# Patient Record
Sex: Male | Born: 1980 | Race: Black or African American | Hispanic: No | Marital: Single | Smoking: Former smoker
Health system: Southern US, Community
[De-identification: ages and names within clinical notes are randomized; demographics above are authoritative.]

## PROBLEM LIST (undated history)

## (undated) ENCOUNTER — Ambulatory Visit

## (undated) ENCOUNTER — Ambulatory Visit: Payer: No Typology Code available for payment source

## (undated) DIAGNOSIS — G830 Diplegia of upper limbs: Secondary | ICD-10-CM

## (undated) DIAGNOSIS — Y249XXA Unspecified firearm discharge, undetermined intent, initial encounter: Secondary | ICD-10-CM

## (undated) DIAGNOSIS — W3400XA Accidental discharge from unspecified firearms or gun, initial encounter: Secondary | ICD-10-CM

## (undated) HISTORY — PX: SPINE SURGERY: SHX786

## (undated) HISTORY — PX: BACK SURGERY: SHX140

## (undated) HISTORY — PX: ABDOMINAL SURGERY: SHX537

---
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
## 2003-04-17 ENCOUNTER — Emergency Department (HOSPITAL_COMMUNITY): Admission: EM | Admit: 2003-04-17 | Discharge: 2003-04-17 | Payer: Self-pay | Admitting: Emergency Medicine

## 2004-03-02 ENCOUNTER — Ambulatory Visit: Payer: Self-pay | Admitting: Internal Medicine

## 2004-09-13 ENCOUNTER — Emergency Department (HOSPITAL_COMMUNITY): Admission: EM | Admit: 2004-09-13 | Discharge: 2004-09-13 | Payer: Self-pay | Admitting: *Deleted

## 2005-07-17 ENCOUNTER — Ambulatory Visit: Payer: Self-pay | Admitting: Physical Medicine & Rehabilitation

## 2005-07-17 ENCOUNTER — Inpatient Hospital Stay (HOSPITAL_COMMUNITY): Admission: AC | Admit: 2005-07-17 | Discharge: 2005-07-31 | Payer: Self-pay

## 2005-07-31 ENCOUNTER — Inpatient Hospital Stay (HOSPITAL_COMMUNITY)
Admission: RE | Admit: 2005-07-31 | Discharge: 2005-08-16 | Payer: Self-pay | Admitting: Physical Medicine & Rehabilitation

## 2005-08-19 ENCOUNTER — Encounter
Admission: RE | Admit: 2005-08-19 | Discharge: 2005-11-17 | Payer: Self-pay | Admitting: Physical Medicine & Rehabilitation

## 2005-08-20 ENCOUNTER — Ambulatory Visit: Payer: Self-pay | Admitting: Family Medicine

## 2005-09-17 ENCOUNTER — Ambulatory Visit: Payer: Self-pay | Admitting: Family Medicine

## 2005-09-19 ENCOUNTER — Ambulatory Visit: Payer: Self-pay | Admitting: Physical Medicine & Rehabilitation

## 2005-09-19 ENCOUNTER — Encounter
Admission: RE | Admit: 2005-09-19 | Discharge: 2005-12-18 | Payer: Self-pay | Admitting: Physical Medicine & Rehabilitation

## 2005-10-31 ENCOUNTER — Ambulatory Visit (HOSPITAL_COMMUNITY): Admission: RE | Admit: 2005-10-31 | Discharge: 2005-10-31 | Payer: Self-pay | Admitting: Urology

## 2005-11-18 ENCOUNTER — Encounter
Admission: RE | Admit: 2005-11-18 | Discharge: 2006-02-16 | Payer: Self-pay | Admitting: Physical Medicine & Rehabilitation

## 2005-12-12 ENCOUNTER — Ambulatory Visit: Payer: Self-pay | Admitting: Family Medicine

## 2006-01-13 ENCOUNTER — Encounter: Admission: RE | Admit: 2006-01-13 | Discharge: 2006-01-13 | Payer: Self-pay | Admitting: Neurological Surgery

## 2006-02-17 ENCOUNTER — Encounter
Admission: RE | Admit: 2006-02-17 | Discharge: 2006-05-18 | Payer: Self-pay | Admitting: Physical Medicine & Rehabilitation

## 2006-03-18 DIAGNOSIS — F5232 Male orgasmic disorder: Secondary | ICD-10-CM | POA: Insufficient documentation

## 2006-03-18 DIAGNOSIS — G834 Cauda equina syndrome: Secondary | ICD-10-CM

## 2006-03-18 HISTORY — DX: Cauda equina syndrome: G83.4

## 2006-03-18 HISTORY — DX: Male orgasmic disorder: F52.32

## 2006-05-08 ENCOUNTER — Ambulatory Visit: Payer: Self-pay | Admitting: Family Medicine

## 2006-05-08 ENCOUNTER — Encounter: Payer: Self-pay | Admitting: Family Medicine

## 2006-05-08 DIAGNOSIS — G4700 Insomnia, unspecified: Secondary | ICD-10-CM

## 2006-05-08 DIAGNOSIS — M6281 Muscle weakness (generalized): Secondary | ICD-10-CM | POA: Insufficient documentation

## 2006-05-08 HISTORY — DX: Insomnia, unspecified: G47.00

## 2006-05-19 ENCOUNTER — Encounter
Admission: RE | Admit: 2006-05-19 | Discharge: 2006-08-17 | Payer: Self-pay | Admitting: Physical Medicine & Rehabilitation

## 2006-08-14 ENCOUNTER — Encounter
Admission: RE | Admit: 2006-08-14 | Discharge: 2006-11-12 | Payer: Self-pay | Admitting: Physical Medicine & Rehabilitation

## 2007-09-02 ENCOUNTER — Encounter: Payer: Self-pay | Admitting: Family Medicine

## 2007-12-29 ENCOUNTER — Emergency Department (HOSPITAL_COMMUNITY): Admission: EM | Admit: 2007-12-29 | Discharge: 2007-12-29 | Payer: Self-pay | Admitting: Family Medicine

## 2008-03-04 ENCOUNTER — Emergency Department (HOSPITAL_COMMUNITY): Admission: EM | Admit: 2008-03-04 | Discharge: 2008-03-04 | Payer: Self-pay | Admitting: Emergency Medicine

## 2008-03-16 ENCOUNTER — Encounter: Admission: RE | Admit: 2008-03-16 | Discharge: 2008-04-18 | Payer: Self-pay | Admitting: Orthopedic Surgery

## 2008-07-25 ENCOUNTER — Emergency Department (HOSPITAL_BASED_OUTPATIENT_CLINIC_OR_DEPARTMENT_OTHER): Admission: EM | Admit: 2008-07-25 | Discharge: 2008-07-25 | Payer: Self-pay | Admitting: Emergency Medicine

## 2008-07-25 ENCOUNTER — Ambulatory Visit: Payer: Self-pay | Admitting: Radiology

## 2008-09-15 ENCOUNTER — Encounter: Admission: RE | Admit: 2008-09-15 | Discharge: 2008-10-03 | Payer: Self-pay | Admitting: Neurological Surgery

## 2009-03-15 ENCOUNTER — Emergency Department (HOSPITAL_COMMUNITY): Admission: EM | Admit: 2009-03-15 | Discharge: 2009-03-15 | Payer: Self-pay | Admitting: Family Medicine

## 2010-07-01 ENCOUNTER — Encounter: Payer: Self-pay | Admitting: Urology
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
## 2011-05-04 ENCOUNTER — Encounter: Payer: Self-pay | Admitting: *Deleted

## 2011-05-04 ENCOUNTER — Emergency Department (HOSPITAL_BASED_OUTPATIENT_CLINIC_OR_DEPARTMENT_OTHER)
Admission: EM | Admit: 2011-05-04 | Discharge: 2011-05-04 | Disposition: A | Discharge status: Home / Self Care | Payer: Medicare Other | Attending: Emergency Medicine | Admitting: Emergency Medicine

## 2011-05-04 DIAGNOSIS — K047 Periapical abscess without sinus: Secondary | ICD-10-CM | POA: Insufficient documentation

## 2011-05-04 DIAGNOSIS — S0993XA Unspecified injury of face, initial encounter: Secondary | ICD-10-CM

## 2011-05-04 DIAGNOSIS — K0381 Cracked tooth: Secondary | ICD-10-CM | POA: Insufficient documentation

## 2011-05-04 DIAGNOSIS — K089 Disorder of teeth and supporting structures, unspecified: Secondary | ICD-10-CM | POA: Insufficient documentation

## 2011-05-04 MED ORDER — AMOXICILLIN 500 MG PO CAPS: 500.0000 mg | ORAL_CAPSULE | Freq: Three times a day (TID) | ORAL | Status: AC

## 2011-05-04 MED ORDER — HYDROCODONE-ACETAMINOPHEN 5-325 MG PO TABS: 1.0000 | ORAL_TABLET | ORAL | Status: AC | PRN

## 2011-05-04 NOTE — ED Provider Notes (Signed)
History     CSN: [id number] Arrival date & time: 05/04/2011  6:27 PM   First MD Initiated Contact with Patient 05/04/11 1837      Chief Complaint  Patient presents with  . Dental Pain    (Consider location/radiation/quality/duration/timing/severity/associated sxs/prior treatment) HPI Comments: Patient here after cracking his left #19 tooth on Thanksgiving - reports swelling around the tooth that started yesterday  Patient is a 30 y.o. male presenting with tooth pain. The history is provided by the patient. No language interpreter was used.  Dental PainThe primary symptoms include dental injury. Primary symptoms do not include oral bleeding, oral lesions, fever, sore throat or cough. The symptoms began 2 days ago. The symptoms are worsening. The symptoms are new. The symptoms occur constantly.  Additional symptoms include: dental sensitivity to temperature, gum swelling and gum tenderness. Additional symptoms do not include: trismus, facial swelling, trouble swallowing, dry mouth, drooling and ear pain. Medical issues include: periodontal disease.    History reviewed. No pertinent past medical history.  Past Surgical History  Procedure Date  . Back surgery     History reviewed. No pertinent family history.  History  Substance Use Topics  . Smoking status: Not on file  . Smokeless tobacco: Not on file  . Alcohol Use:       Review of Systems  Constitutional: Negative for fever.  HENT: Negative for ear pain, sore throat, facial swelling, drooling and trouble swallowing.   Respiratory: Negative for cough.   All other systems reviewed and are negative.    Allergies  Review of patient's allergies indicates no known allergies.  Home Medications   Current Outpatient Rx  Name Route Sig Dispense Refill  . IBUPROFEN 200 MG PO TABS Oral Take 400 mg by mouth every 6 (six) hours as needed. For pain     . ONE-DAILY MULTI VITAMINS PO TABS Oral Take 1 tablet by mouth daily.          BP 122/92  Pulse 72  Temp(Src) 98.1 F (36.7 C) (Oral)  Resp 18  Ht 6' (1.829 m)  Wt 165 lb (74.844 kg)  BMI 22.38 kg/m2  SpO2 98%  Physical Exam  Nursing note and vitals reviewed. Constitutional: He is oriented to person, place, and time. He appears well-developed and well-nourished.  HENT:  Head: Normocephalic and atraumatic.  Right Ear: External ear normal.  Left Ear: External ear normal.  Nose: Nose normal.  Mouth/Throat: Oropharynx is clear and moist. Abnormal dentition. Dental abscesses present.    Eyes: Pupils are equal, round, and reactive to light.  Neck: Normal range of motion. Neck supple.  Cardiovascular: Normal rate, regular rhythm and normal heart sounds.   Pulmonary/Chest: Effort normal and breath sounds normal. No respiratory distress.  Abdominal: Soft. There is tenderness.  Musculoskeletal: Normal range of motion.  Neurological: He is alert and oriented to person, place, and time.  Skin: Skin is warm and dry.  Psychiatric: He has a normal mood and affect.    ED Course  Procedures (including critical care time)  Labs Reviewed - No data to display No results found.   Tooth pain with abscess   MDM  Patient with dental pain and now abscess formation, there is no trismus and the patient is able to tolerate po's - will follow up with a dentist this coming week.        Izola Price [place], [place] 05/04/11 1851

## 2011-05-04 NOTE — ED Notes (Signed)
Pt states his left lower tooth has been hurting since Thanksgiving

## 2011-05-04 NOTE — ED Provider Notes (Signed)
Medical screening examination/treatment/procedure(s) were performed by non-physician practitioner and as supervising physician I was immediately available for consultation/collaboration.   Nat Christen, MD 05/04/11 [phone]

## 2012-05-11 ENCOUNTER — Inpatient Hospital Stay (HOSPITAL_COMMUNITY)
Admit: 2012-05-11 | Discharge: 2012-05-13 | DRG: 908 | Disposition: A | Discharge status: Home / Self Care | Payer: Medicare Other | Attending: Emergency Medicine | Admitting: Emergency Medicine

## 2012-05-11 ENCOUNTER — Emergency Department (HOSPITAL_COMMUNITY): Payer: Medicare Other

## 2012-05-11 DIAGNOSIS — S15009A Unspecified injury of unspecified carotid artery, initial encounter: Secondary | ICD-10-CM | POA: Diagnosis present

## 2012-05-11 DIAGNOSIS — S15309A Unspecified injury of unspecified internal jugular vein, initial encounter: Principal | ICD-10-CM | POA: Diagnosis present

## 2012-05-11 DIAGNOSIS — T148XXA Other injury of unspecified body region, initial encounter: Secondary | ICD-10-CM

## 2012-05-11 DIAGNOSIS — S0181XA Laceration without foreign body of other part of head, initial encounter: Secondary | ICD-10-CM | POA: Diagnosis present

## 2012-05-11 DIAGNOSIS — Y998 Other external cause status: Secondary | ICD-10-CM

## 2012-05-11 DIAGNOSIS — IMO0002 Reserved for concepts with insufficient information to code with codable children: Secondary | ICD-10-CM

## 2012-05-11 DIAGNOSIS — D62 Acute posthemorrhagic anemia: Secondary | ICD-10-CM | POA: Diagnosis present

## 2012-05-11 DIAGNOSIS — Y929 Unspecified place or not applicable: Secondary | ICD-10-CM

## 2012-05-11 DIAGNOSIS — S0180XA Unspecified open wound of other part of head, initial encounter: Secondary | ICD-10-CM | POA: Diagnosis present

## 2012-05-11 DIAGNOSIS — F17200 Nicotine dependence, unspecified, uncomplicated: Secondary | ICD-10-CM | POA: Diagnosis present

## 2012-05-11 DIAGNOSIS — G8389 Other specified paralytic syndromes: Secondary | ICD-10-CM | POA: Diagnosis present

## 2012-05-11 HISTORY — DX: Diplegia of upper limbs: G83.0

## 2012-05-11 HISTORY — DX: Accidental discharge from unspecified firearms or gun, initial encounter: W34.00XA

## 2012-05-11 HISTORY — DX: Unspecified firearm discharge, undetermined intent, initial encounter: Y24.9XXA

## 2012-05-11 LAB — CG4 I-STAT (LACTIC ACID): Lactic Acid, Venous: 3.27 mmol/L — ABNORMAL HIGH (ref 0.5–2.2)

## 2012-05-11 LAB — POCT I-STAT, CHEM 8
Calcium, Ion: 1.1 mmol/L — ABNORMAL LOW (ref 1.12–1.23)
Chloride: 102 mEq/L (ref 96–112)
Glucose, Bld: 97 mg/dL (ref 70–99)
HCT: 40 % (ref 39.0–52.0)
TCO2: 22 mmol/L (ref 0–100)

## 2012-05-11 LAB — CBC
HCT: 35.7 % — ABNORMAL LOW (ref 39.0–52.0)
Hemoglobin: 12.6 g/dL — ABNORMAL LOW (ref 13.0–17.0)
MCV: 89 fL (ref 78.0–100.0)
RBC: 4.01 MIL/uL — ABNORMAL LOW (ref 4.22–5.81)
WBC: 9.5 10*3/uL (ref 4.0–10.5)

## 2012-05-11 LAB — PROTIME-INR
INR: 1.11 (ref 0.00–1.49)
Prothrombin Time: 14.2 seconds (ref 11.6–15.2)

## 2012-05-11 MED ORDER — FENTANYL CITRATE 0.05 MG/ML IJ SOLN
50.0000 ug | INTRAMUSCULAR | Status: DC | PRN
  Administered 2012-05-12: 50 ug via INTRAVENOUS
  Filled 2012-05-11: qty 2

## 2012-05-11 MED ORDER — TETANUS-DIPHTH-ACELL PERTUSSIS 5-2.5-18.5 LF-MCG/0.5 IM SUSP
0.5000 mL | Freq: Once | INTRAMUSCULAR | Status: AC
  Administered 2012-05-12: 0.5 mL via INTRAMUSCULAR
  Filled 2012-05-11: qty 0.5

## 2012-05-11 NOTE — ED Notes (Signed)
ont CT talble at this time, preparing to scan neck and face, VSS, no changes, alert, NAD, calm, interactive, speech clear, resps e/u. Dr. Dwain Sarna present in scanner room.

## 2012-05-11 NOTE — ED Notes (Signed)
To CT, no changes, VSS.  
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
## 2012-05-11 NOTE — ED Notes (Signed)
Emergency release blood received from blood bank

## 2012-05-12 ENCOUNTER — Encounter (HOSPITAL_COMMUNITY): Payer: Self-pay | Admitting: *Deleted

## 2012-05-12 ENCOUNTER — Encounter (HOSPITAL_COMMUNITY): Payer: Self-pay | Admitting: Anesthesiology

## 2012-05-12 ENCOUNTER — Emergency Department (HOSPITAL_COMMUNITY): Payer: Medicare Other | Admitting: Anesthesiology

## 2012-05-12 ENCOUNTER — Encounter (HOSPITAL_COMMUNITY): Disposition: A | Discharge status: Home / Self Care | Payer: Self-pay

## 2012-05-12 DIAGNOSIS — S15009A Unspecified injury of unspecified carotid artery, initial encounter: Secondary | ICD-10-CM

## 2012-05-12 DIAGNOSIS — S0180XA Unspecified open wound of other part of head, initial encounter: Secondary | ICD-10-CM

## 2012-05-12 DIAGNOSIS — W268XXA Contact with other sharp object(s), not elsewhere classified, initial encounter: Secondary | ICD-10-CM

## 2012-05-12 DIAGNOSIS — D62 Acute posthemorrhagic anemia: Secondary | ICD-10-CM

## 2012-05-12 HISTORY — PX: ENDARTERECTOMY: SHX5162

## 2012-05-12 LAB — COMPREHENSIVE METABOLIC PANEL
Albumin: 4 g/dL (ref 3.5–5.2)
Alkaline Phosphatase: 75 U/L (ref 39–117)
BUN: 12 mg/dL (ref 6–23)
Calcium: 8.7 mg/dL (ref 8.4–10.5)
Creatinine, Ser: 0.87 mg/dL (ref 0.50–1.35)
GFR calc Af Amer: >90 mL/min (ref >90)
Glucose, Bld: 101 mg/dL — ABNORMAL HIGH (ref 70–99)
Potassium: 2.9 mEq/L — ABNORMAL LOW (ref 3.5–5.1)
Total Protein: 6.8 g/dL (ref 6.0–8.3)

## 2012-05-12 LAB — CBC
MCH: 31.1 pg (ref 26.0–34.0)
MCHC: 35 g/dL (ref 30.0–36.0)
MCV: 88.9 fL (ref 78.0–100.0)
Platelets: 212 10*3/uL (ref 150–400)
RDW: 12.6 % (ref 11.5–15.5)

## 2012-05-12 LAB — URINALYSIS, MICROSCOPIC ONLY
Bilirubin Urine: NEGATIVE
Glucose, UA: NEGATIVE mg/dL
Specific Gravity, Urine: 1.01 (ref 1.005–1.030)
pH: 6.5 (ref 5.0–8.0)

## 2012-05-12 LAB — BASIC METABOLIC PANEL
Calcium: 8.1 mg/dL — ABNORMAL LOW (ref 8.4–10.5)
Creatinine, Ser: 0.76 mg/dL (ref 0.50–1.35)
GFR calc non Af Amer: >90 mL/min (ref >90)
Sodium: 139 mEq/L (ref 135–145)

## 2012-05-12 SURGERY — ENDARTERECTOMY, CAROTID
Anesthesia: General | Site: Neck | Laterality: Left | Wound class: Dirty or Infected

## 2012-05-12 MED ORDER — MIDAZOLAM HCL 5 MG/5ML IJ SOLN
INTRAMUSCULAR | Status: DC | PRN
  Administered 2012-05-12: 2 mg via INTRAVENOUS

## 2012-05-12 MED ORDER — ONDANSETRON HCL 4 MG/2ML IJ SOLN
INTRAMUSCULAR | Status: DC | PRN
  Administered 2012-05-12: 4 mg via INTRAVENOUS

## 2012-05-12 MED ORDER — HEPARIN SODIUM (PORCINE) 1000 UNIT/ML IJ SOLN
INTRAMUSCULAR | Status: DC | PRN
  Administered 2012-05-12: 7000 [IU] via INTRAVENOUS

## 2012-05-12 MED ORDER — FENTANYL CITRATE 0.05 MG/ML IJ SOLN
INTRAMUSCULAR | Status: DC | PRN
  Administered 2012-05-12: 100 ug via INTRAVENOUS
  Administered 2012-05-12: 150 ug via INTRAVENOUS

## 2012-05-12 MED ORDER — KCL IN DEXTROSE-NACL 20-5-0.9 MEQ/L-%-% IV SOLN
INTRAVENOUS | Status: DC
  Administered 2012-05-12: 06:00:00 via INTRAVENOUS
  Administered 2012-05-12: 75 mL/h via INTRAVENOUS
  Administered 2012-05-13: 06:00:00 via INTRAVENOUS
  Filled 2012-05-12 (×4): qty 1000

## 2012-05-12 MED ORDER — ONDANSETRON HCL 4 MG/2ML IJ SOLN
4.0000 mg | Freq: Four times a day (QID) | INTRAMUSCULAR | Status: DC | PRN
  Administered 2012-05-12: 4 mg via INTRAVENOUS
  Filled 2012-05-12: qty 2

## 2012-05-12 MED ORDER — PANTOPRAZOLE SODIUM 40 MG IV SOLR
40.0000 mg | Freq: Every day | INTRAVENOUS | Status: DC
  Administered 2012-05-12: 40 mg via INTRAVENOUS
  Filled 2012-05-12 (×4): qty 40

## 2012-05-12 MED ORDER — LIDOCAINE HCL (CARDIAC) 20 MG/ML IV SOLN
INTRAVENOUS | Status: DC | PRN
  Administered 2012-05-12: 100 mg via INTRAVENOUS

## 2012-05-12 MED ORDER — ALUM & MAG HYDROXIDE-SIMETH 200-200-20 MG/5ML PO SUSP: 15.0000 mL | ORAL | Status: DC | PRN

## 2012-05-12 MED ORDER — GLYCOPYRROLATE 0.2 MG/ML IJ SOLN
INTRAMUSCULAR | Status: DC | PRN
  Administered 2012-05-12: .6 mg via INTRAVENOUS

## 2012-05-12 MED ORDER — SUCCINYLCHOLINE CHLORIDE 20 MG/ML IJ SOLN
INTRAMUSCULAR | Status: DC | PRN
  Administered 2012-05-12: 120 mg via INTRAVENOUS

## 2012-05-12 MED ORDER — PHENOL 1.4 % MT LIQD: 1.0000 | OROMUCOSAL | Status: DC | PRN

## 2012-05-12 MED ORDER — GELATIN ABSORBABLE 100 EX MISC
CUTANEOUS | Status: DC | PRN
  Administered 2012-05-12: 03:00:00 via TOPICAL

## 2012-05-12 MED ORDER — LACTATED RINGERS IV SOLN
INTRAVENOUS | Status: DC | PRN
  Administered 2012-05-12 (×3): via INTRAVENOUS

## 2012-05-12 MED ORDER — LABETALOL HCL 5 MG/ML IV SOLN: 10.0000 mg | INTRAVENOUS | Status: DC | PRN

## 2012-05-12 MED ORDER — 0.9 % SODIUM CHLORIDE (POUR BTL) OPTIME
TOPICAL | Status: DC | PRN
  Administered 2012-05-12: 200 mL

## 2012-05-12 MED ORDER — METOPROLOL TARTRATE 1 MG/ML IV SOLN: 2.0000 mg | INTRAVENOUS | Status: DC | PRN

## 2012-05-12 MED ORDER — PNEUMOCOCCAL VAC POLYVALENT 25 MCG/0.5ML IJ INJ
0.5000 mL | INJECTION | INTRAMUSCULAR | Status: AC
  Administered 2012-05-12: 0.5 mL via INTRAMUSCULAR
  Filled 2012-05-12 (×2): qty 0.5

## 2012-05-12 MED ORDER — CEFAZOLIN SODIUM-DEXTROSE 2-3 GM-% IV SOLR
2.0000 g | Freq: Once | INTRAVENOUS | Status: AC
  Administered 2012-05-12: 2 g via INTRAVENOUS
  Filled 2012-05-12: qty 50

## 2012-05-12 MED ORDER — PROTAMINE SULFATE 10 MG/ML IV SOLN
INTRAVENOUS | Status: DC | PRN
  Administered 2012-05-12: 40 mg via INTRAVENOUS
  Administered 2012-05-12: 10 mg via INTRAVENOUS

## 2012-05-12 MED ORDER — SODIUM CHLORIDE 0.9 % IV SOLN
INTRAVENOUS | Status: DC | PRN
  Administered 2012-05-12: 01:00:00 via INTRAVENOUS

## 2012-05-12 MED ORDER — HYDROMORPHONE HCL PF 1 MG/ML IJ SOLN
0.2500 mg | INTRAMUSCULAR | Status: DC | PRN
  Administered 2012-05-12 (×4): 0.5 mg via INTRAVENOUS

## 2012-05-12 MED ORDER — DOPAMINE-DEXTROSE 3.2-5 MG/ML-% IV SOLN: 3.0000 ug/kg/min | INTRAVENOUS | Status: DC | PRN

## 2012-05-12 MED ORDER — HYDROMORPHONE HCL PF 1 MG/ML IJ SOLN
INTRAMUSCULAR | Status: AC
  Filled 2012-05-12: qty 1

## 2012-05-12 MED ORDER — SODIUM CHLORIDE 0.9 % IJ SOLN
INTRAMUSCULAR | Status: AC
  Administered 2012-05-12: 10 mL
  Filled 2012-05-12: qty 10

## 2012-05-12 MED ORDER — ROCURONIUM BROMIDE 100 MG/10ML IV SOLN
INTRAVENOUS | Status: DC | PRN
  Administered 2012-05-12: 50 mg via INTRAVENOUS
  Administered 2012-05-12 (×2): 10 mg via INTRAVENOUS

## 2012-05-12 MED ORDER — IOHEXOL 300 MG/ML  SOLN
75.0000 mL | Freq: Once | INTRAMUSCULAR | Status: AC | PRN
  Administered 2012-05-12: 75 mL via INTRAVENOUS

## 2012-05-12 MED ORDER — DOCUSATE SODIUM 100 MG PO CAPS
100.0000 mg | ORAL_CAPSULE | Freq: Every day | ORAL | Status: DC
  Administered 2012-05-13: 100 mg via ORAL
  Filled 2012-05-12: qty 1

## 2012-05-12 MED ORDER — HEPARIN SODIUM (PORCINE) 5000 UNIT/ML IJ SOLN
INTRAMUSCULAR | Status: DC | PRN
  Administered 2012-05-12: 02:00:00

## 2012-05-12 MED ORDER — SODIUM CHLORIDE 0.9 % IV SOLN: 500.0000 mL | Freq: Once | INTRAVENOUS | Status: AC | PRN

## 2012-05-12 MED ORDER — DEXTROSE 5 % IV SOLN
1.5000 g | Freq: Two times a day (BID) | INTRAVENOUS | Status: DC
  Administered 2012-05-12 – 2012-05-13 (×3): 1.5 g via INTRAVENOUS
  Filled 2012-05-12 (×4): qty 1.5

## 2012-05-12 MED ORDER — NEOSTIGMINE METHYLSULFATE 1 MG/ML IJ SOLN
INTRAMUSCULAR | Status: DC | PRN
  Administered 2012-05-12: 4.5 mg via INTRAVENOUS

## 2012-05-12 MED ORDER — ALBUMIN HUMAN 5 % IV SOLN
INTRAVENOUS | Status: DC | PRN
  Administered 2012-05-12 (×2): via INTRAVENOUS

## 2012-05-12 MED ORDER — HYDRALAZINE HCL 20 MG/ML IJ SOLN: 10.0000 mg | INTRAMUSCULAR | Status: DC | PRN

## 2012-05-12 MED ORDER — EPHEDRINE SULFATE 50 MG/ML IJ SOLN
INTRAMUSCULAR | Status: DC | PRN
  Administered 2012-05-12: 10 mg via INTRAVENOUS
  Administered 2012-05-12: 5 mg via INTRAVENOUS

## 2012-05-12 MED ORDER — OXYCODONE-ACETAMINOPHEN 5-325 MG PO TABS
1.0000 | ORAL_TABLET | ORAL | Status: DC | PRN
  Administered 2012-05-12 – 2012-05-13 (×4): 2 via ORAL
  Filled 2012-05-12 (×4): qty 2

## 2012-05-12 MED ORDER — ACETAMINOPHEN 650 MG RE SUPP: 325.0000 mg | RECTAL | Status: DC | PRN

## 2012-05-12 MED ORDER — PHENYLEPHRINE HCL 10 MG/ML IJ SOLN
INTRAMUSCULAR | Status: DC | PRN
  Administered 2012-05-12 (×2): 80 ug via INTRAVENOUS

## 2012-05-12 MED ORDER — GUAIFENESIN-DM 100-10 MG/5ML PO SYRP: 15.0000 mL | ORAL_SOLUTION | ORAL | Status: DC | PRN

## 2012-05-12 MED ORDER — MORPHINE SULFATE 2 MG/ML IJ SOLN
2.0000 mg | INTRAMUSCULAR | Status: DC | PRN
  Administered 2012-05-12 (×2): 4 mg via INTRAVENOUS
  Administered 2012-05-12: 2 mg via INTRAVENOUS
  Filled 2012-05-12: qty 1
  Filled 2012-05-12 (×2): qty 2

## 2012-05-12 MED ORDER — POTASSIUM CHLORIDE CRYS ER 20 MEQ PO TBCR: 20.0000 meq | EXTENDED_RELEASE_TABLET | Freq: Once | ORAL | Status: AC | PRN

## 2012-05-12 MED ORDER — METOPROLOL TARTRATE 1 MG/ML IV SOLN
INTRAVENOUS | Status: DC | PRN
  Administered 2012-05-12: 2.5 mg via INTRAVENOUS

## 2012-05-12 MED ORDER — ACETAMINOPHEN 325 MG PO TABS: 325.0000 mg | ORAL_TABLET | ORAL | Status: DC | PRN

## 2012-05-12 MED ORDER — PROPOFOL 10 MG/ML IV BOLUS
INTRAVENOUS | Status: DC | PRN
  Administered 2012-05-12: 150 mg via INTRAVENOUS
  Administered 2012-05-12 (×2): 50 mg via INTRAVENOUS

## 2012-05-12 SURGICAL SUPPLY — 49 items
APL SKNCLS STERI-STRIP NONHPOA (GAUZE/BANDAGES/DRESSINGS) ×2
BENZOIN TINCTURE PRP APPL 2/3 (GAUZE/BANDAGES/DRESSINGS) ×3 IMPLANT
CANISTER SUCTION 2500CC (MISCELLANEOUS) ×2 IMPLANT
CATH ROBINSON RED A/P 18FR (CATHETERS) ×2 IMPLANT
CLIP LIGATING EXTRA MED SLVR (CLIP) ×2 IMPLANT
CLIP LIGATING EXTRA SM BLUE (MISCELLANEOUS) ×2 IMPLANT
CLOTH BEACON ORANGE TIMEOUT ST (SAFETY) ×2 IMPLANT
CLSR STERI-STRIP ANTIMIC 1/2X4 (GAUZE/BANDAGES/DRESSINGS) ×1 IMPLANT
COVER SURGICAL LIGHT HANDLE (MISCELLANEOUS) ×2 IMPLANT
CRADLE DONUT ADULT HEAD (MISCELLANEOUS) ×2 IMPLANT
DECANTER SPIKE VIAL GLASS SM (MISCELLANEOUS) IMPLANT
DRAIN CHANNEL 15F RND FF W/TCR (WOUND CARE) ×1 IMPLANT
DRAIN HEMOVAC 1/8 X 5 (WOUND CARE) IMPLANT
DRAPE WARM FLUID 44X44 (DRAPE) ×2 IMPLANT
DRSG COVADERM 4X6 (GAUZE/BANDAGES/DRESSINGS) ×1 IMPLANT
ELECT REM PT RETURN 9FT ADLT (ELECTROSURGICAL) ×2
ELECTRODE REM PT RTRN 9FT ADLT (ELECTROSURGICAL) ×1 IMPLANT
EVACUATOR SILICONE 100CC (DRAIN) ×1 IMPLANT
GEL ULTRASOUND 20GR AQUASONIC (MISCELLANEOUS) IMPLANT
GLOVE BIO SURGEON STRL SZ 6.5 (GLOVE) ×3 IMPLANT
GLOVE BIOGEL PI IND STRL 7.0 (GLOVE) IMPLANT
GLOVE BIOGEL PI INDICATOR 7.0 (GLOVE) ×2
GLOVE SS BIOGEL STRL SZ 7.5 (GLOVE) ×1 IMPLANT
GLOVE SUPERSENSE BIOGEL SZ 7.5 (GLOVE) ×1
GOWN STRL NON-REIN LRG LVL3 (GOWN DISPOSABLE) ×6 IMPLANT
KIT BASIN OR (CUSTOM PROCEDURE TRAY) ×2 IMPLANT
KIT ROOM TURNOVER OR (KITS) ×2 IMPLANT
NEEDLE 22X1 1/2 (OR ONLY) (NEEDLE) IMPLANT
NS IRRIG 1000ML POUR BTL (IV SOLUTION) ×4 IMPLANT
PACK CAROTID (CUSTOM PROCEDURE TRAY) ×2 IMPLANT
PAD ARMBOARD 7.5X6 YLW CONV (MISCELLANEOUS) ×4 IMPLANT
SHUNT CAROTID BYPASS 10 (VASCULAR PRODUCTS) IMPLANT
SHUNT CAROTID BYPASS 12FRX15.5 (VASCULAR PRODUCTS) IMPLANT
SPECIMEN JAR SMALL (MISCELLANEOUS) ×2 IMPLANT
SPONGE GAUZE 4X4 12PLY (GAUZE/BANDAGES/DRESSINGS) ×1 IMPLANT
STRIP CLOSURE SKIN 1/2X4 (GAUZE/BANDAGES/DRESSINGS) ×2 IMPLANT
SUT ETHILON 3 0 PS 1 (SUTURE) ×1 IMPLANT
SUT PROLENE 5 0 C 1 24 (SUTURE) ×4 IMPLANT
SUT PROLENE 6 0 CC (SUTURE) ×2 IMPLANT
SUT VIC AB 3-0 SH 27 (SUTURE) ×8
SUT VIC AB 3-0 SH 27X BRD (SUTURE) ×2 IMPLANT
SUT VIC AB 4-0 PS2 27 (SUTURE) ×1 IMPLANT
SUT VICRYL 4-0 PS2 18IN ABS (SUTURE) ×2 IMPLANT
SYR CONTROL 10ML LL (SYRINGE) IMPLANT
SYRINGE 10CC LL (SYRINGE) ×1 IMPLANT
TAPE CLOTH SURG 4X10 WHT LF (GAUZE/BANDAGES/DRESSINGS) ×1 IMPLANT
TOWEL OR 17X24 6PK STRL BLUE (TOWEL DISPOSABLE) ×2 IMPLANT
TOWEL OR 17X26 10 PK STRL BLUE (TOWEL DISPOSABLE) ×2 IMPLANT
WATER STERILE IRR 1000ML POUR (IV SOLUTION) ×2 IMPLANT
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
## 2012-05-12 NOTE — Anesthesia Procedure Notes (Signed)
Procedure Name: Intubation Date/Time: 05/12/2012 1:20 AM Performed by: Iowa Kappes S Pre-anesthesia Checklist: Patient identified, Timeout performed, Emergency Drugs available, Suction available and Patient being monitored Patient Re-evaluated:Patient Re-evaluated prior to inductionOxygen Delivery Method: Circle system utilized Preoxygenation: Pre-oxygenation with 100% oxygen Intubation Type: IV induction and Rapid sequence Grade View: Grade I Tube type: Oral Tube size: 7.5 mm Number of attempts: 1 Airway Equipment and Method: Stylet and Video-laryngoscopy Placement Confirmation: ETT inserted through vocal cords under direct vision,  positive ETCO2 and breath sounds checked- equal and bilateral Secured at: 23 cm Tube secured with: Tape Dental Injury: Teeth and Oropharynx as per pre-operative assessment  Comments: RSI with glidescope

## 2012-05-12 NOTE — ED Notes (Addendum)
Pt used urinal after scans. No changes. Sample saved.

## 2012-05-12 NOTE — ED Notes (Signed)
Dressing bright red, saturated, pressure applied.
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
## 2012-05-12 NOTE — ED Notes (Signed)
Cell phone, left leg brace inventoried and given to security.
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
## 2012-05-12 NOTE — Transfer of Care (Signed)
Immediate Anesthesia Transfer of Care Note  Patient: Martin Henry  Procedure(s) Performed: Procedure(s) (LRB) with comments: ENDARTERECTOMY CAROTID (Left) - Exploration of left Neck, Repair of Common Carotid Artery, Repair of facial artery, and Vertebral Artery.  Patient Location: PACU  Anesthesia Type:General  Level of Consciousness: awake, alert  and oriented  Airway & Oxygen Therapy: Patient Spontanous Breathing and Patient connected to nasal cannula oxygen  Post-op Assessment: Report given to PACU RN, Post -op Vital signs reviewed and stable   Post vital signs: Reviewed and stable  Complications: No apparent anesthesia complications
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
## 2012-05-12 NOTE — ED Notes (Signed)
Back to ED

## 2012-05-12 NOTE — ED Notes (Signed)
Last PO intake 2030.
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
## 2012-05-12 NOTE — ED Notes (Signed)
Dr. Dwain Sarna at [facility] explaining necessary surgery. Pending arrival of CV MD Dr. Arbie Cookey.
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
## 2012-05-12 NOTE — ED Notes (Signed)
Calm, cooperative, speaking with [employer].
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
## 2012-05-12 NOTE — ED Notes (Signed)
OR ready, preparing to transport to OR holding.
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
## 2012-05-12 NOTE — Preoperative (Signed)
Beta Blockers   Reason not to administer Beta Blockers:Not Applicable 
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
## 2012-05-13 ENCOUNTER — Other Ambulatory Visit: Payer: Self-pay | Admitting: *Deleted

## 2012-05-13 ENCOUNTER — Encounter (HOSPITAL_COMMUNITY): Payer: Self-pay | Admitting: Vascular Surgery

## 2012-05-13 ENCOUNTER — Telehealth: Payer: Self-pay | Admitting: Vascular Surgery

## 2012-05-13 DIAGNOSIS — D62 Acute posthemorrhagic anemia: Secondary | ICD-10-CM

## 2012-05-13 DIAGNOSIS — S15009A Unspecified injury of unspecified carotid artery, initial encounter: Secondary | ICD-10-CM

## 2012-05-13 DIAGNOSIS — S15309A Unspecified injury of unspecified internal jugular vein, initial encounter: Principal | ICD-10-CM | POA: Diagnosis present

## 2012-05-13 DIAGNOSIS — S0181XA Laceration without foreign body of other part of head, initial encounter: Secondary | ICD-10-CM | POA: Diagnosis present

## 2012-05-13 HISTORY — DX: Unspecified injury of unspecified internal jugular vein, initial encounter: S15.309A

## 2012-05-13 HISTORY — DX: Unspecified injury of unspecified carotid artery, initial encounter: S15.009A

## 2012-05-13 HISTORY — DX: Acute posthemorrhagic anemia: D62

## 2012-05-13 LAB — BASIC METABOLIC PANEL
BUN: 3 mg/dL — ABNORMAL LOW (ref 6–23)
CO2: 30 mEq/L (ref 19–32)
Chloride: 101 mEq/L (ref 96–112)
GFR calc non Af Amer: >90 mL/min (ref >90)
Glucose, Bld: 122 mg/dL — ABNORMAL HIGH (ref 70–99)
Potassium: 3.8 mEq/L (ref 3.5–5.1)

## 2012-05-13 LAB — CBC
HCT: 29.1 % — ABNORMAL LOW (ref 39.0–52.0)
Hemoglobin: 10 g/dL — ABNORMAL LOW (ref 13.0–17.0)
MCHC: 34.4 g/dL (ref 30.0–36.0)
RBC: 3.25 MIL/uL — ABNORMAL LOW (ref 4.22–5.81)

## 2012-05-13 MED ORDER — OXYCODONE-ACETAMINOPHEN 5-325 MG PO TABS: 1.0000 | ORAL_TABLET | ORAL | Status: DC | PRN

## 2012-05-13 MED ORDER — PANTOPRAZOLE SODIUM 40 MG PO TBEC
40.0000 mg | DELAYED_RELEASE_TABLET | Freq: Every day | ORAL | Status: DC
  Administered 2012-05-13: 40 mg via ORAL
  Filled 2012-05-13: qty 1

## 2012-05-13 NOTE — Progress Notes (Signed)
Discharged patient home with mom with w/c, discussed and explained discharge instructions, follow up appts., prescription given. No complaints of pain at this time. Beryle Quant

## 2012-05-13 NOTE — Telephone Encounter (Signed)
Spoke with mother to schedule appointment, as pts home # could not accept calls at this time. dpm
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
## 2012-05-13 NOTE — Anesthesia Postprocedure Evaluation (Signed)
  Anesthesia Post-op Note  Patient: Martin Henry  Procedure(s) Performed: Procedure(s) (LRB) with comments: ENDARTERECTOMY CAROTID (Left) - Exploration of left Neck, Repair of Common Carotid Artery, Repair of facial artery, and Vertebral Artery.  Patient Location: PACU  Anesthesia Type:General  Level of Consciousness: awake  Airway and Oxygen Therapy: Patient Spontanous Breathing  Post-op Pain: mild  Post-op Assessment: Post-op Vital signs reviewed  Post-op Vital Signs: Reviewed  Complications: No apparent anesthesia complications
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
## 2012-05-14 ENCOUNTER — Encounter: Payer: Self-pay | Admitting: *Deleted

## 2012-05-14 ENCOUNTER — Telehealth: Payer: Self-pay | Admitting: *Deleted

## 2012-05-14 LAB — TYPE AND SCREEN
Unit division: 0
Unit division: 0
Unit division: 0
Unit division: 0

## 2012-05-14 NOTE — Telephone Encounter (Signed)
Clearence Ped asked me to call him and tell him to start taking 325mg  Aspirin everyday. I did and he confirmed understanding.

## 2012-05-18 ENCOUNTER — Telehealth: Payer: Self-pay

## 2012-05-18 DIAGNOSIS — G8918 Other acute postprocedural pain: Secondary | ICD-10-CM

## 2012-05-18 MED ORDER — HYDROCODONE-ACETAMINOPHEN 5-500 MG PO TABS: 1.0000 | ORAL_TABLET | Freq: Four times a day (QID) | ORAL | Status: DC | PRN

## 2012-05-18 NOTE — Telephone Encounter (Signed)
Phone call from pt.  Requested pain medication refill, for pain in left neck and left jaw line, and left shoulder.  States pain level is 8-9 on 1-10 scale, at times.  Denies any neck swelling, incisional drainage, or breathing, or swallowing problems.  Reports steri- strips starting to peel off.  Has f/u appt. 05/26/12.  Discussed W/ Dr. Myra Gianotti.  Rec'd v.o. To give Hydrocodone/ Acetaminophen 5/500 mg 1 - 2 tabs q 6hrs. Prn/ pain; # 20, no refills.

## 2012-05-25 ENCOUNTER — Encounter: Payer: Self-pay | Admitting: Vascular Surgery

## 2012-05-26 ENCOUNTER — Encounter: Payer: Self-pay | Admitting: Vascular Surgery

## 2012-05-26 ENCOUNTER — Ambulatory Visit (INDEPENDENT_AMBULATORY_CARE_PROVIDER_SITE_OTHER): Payer: Medicare Other | Admitting: Vascular Surgery

## 2012-05-26 VITALS — BP 114/73 | HR 71 | Ht 72.0 in | Wt 155.3 lb

## 2012-05-26 DIAGNOSIS — Z48812 Encounter for surgical aftercare following surgery on the circulatory system: Secondary | ICD-10-CM

## 2012-05-26 DIAGNOSIS — S15009A Unspecified injury of unspecified carotid artery, initial encounter: Secondary | ICD-10-CM

## 2012-05-26 HISTORY — DX: Unspecified injury of unspecified carotid artery, initial encounter: S15.009A
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
## 2012-05-27 NOTE — Addendum Note (Signed)
Addended by: Sharee Pimple on: 05/27/2012 09:10 AM   Modules accepted: Orders

## 2012-11-16 ENCOUNTER — Other Ambulatory Visit: Payer: Self-pay | Admitting: *Deleted

## 2012-11-23 ENCOUNTER — Encounter: Payer: Self-pay | Admitting: Vascular Surgery

## 2012-11-24 ENCOUNTER — Ambulatory Visit: Payer: Medicare Other | Admitting: Vascular Surgery

## 2012-11-24 ENCOUNTER — Other Ambulatory Visit: Payer: Medicare Other
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
## 2019-06-09 ENCOUNTER — Other Ambulatory Visit: Payer: Self-pay | Admitting: Cardiology

## 2019-06-09 DIAGNOSIS — Z20822 Contact with and (suspected) exposure to covid-19: Secondary | ICD-10-CM

## 2019-06-10 LAB — NOVEL CORONAVIRUS, NAA: SARS-CoV-2, NAA: NOT DETECTED

## 2020-12-18 ENCOUNTER — Ambulatory Visit: Payer: Medicare Other | Admitting: Family Medicine

## 2021-02-14 ENCOUNTER — Encounter: Payer: Self-pay | Admitting: Nurse Practitioner
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
## 2021-06-29 NOTE — Progress Notes (Signed)
No show

## 2022-08-08 ENCOUNTER — Encounter (INDEPENDENT_AMBULATORY_CARE_PROVIDER_SITE_OTHER): Payer: Self-pay

## 2022-08-11 ENCOUNTER — Ambulatory Visit
Admission: EM | Admit: 2022-08-11 | Discharge: 2022-08-11 | Disposition: A | Discharge status: Home / Self Care | Payer: Medicare Other | Attending: Urgent Care | Admitting: Urgent Care

## 2022-08-11 ENCOUNTER — Ambulatory Visit (INDEPENDENT_AMBULATORY_CARE_PROVIDER_SITE_OTHER): Payer: Medicare Other

## 2022-08-11 DIAGNOSIS — M25511 Pain in right shoulder: Secondary | ICD-10-CM | POA: Diagnosis not present

## 2022-08-11 DIAGNOSIS — R079 Chest pain, unspecified: Secondary | ICD-10-CM

## 2022-08-11 DIAGNOSIS — S43101A Unspecified dislocation of right acromioclavicular joint, initial encounter: Secondary | ICD-10-CM | POA: Diagnosis not present

## 2022-08-11 DIAGNOSIS — R0782 Intercostal pain: Secondary | ICD-10-CM | POA: Diagnosis not present

## 2022-08-11 DIAGNOSIS — M542 Cervicalgia: Secondary | ICD-10-CM | POA: Diagnosis not present

## 2022-08-11 MED ORDER — CYCLOBENZAPRINE HCL 5 MG PO TABS: 5.0000 mg | ORAL_TABLET | Freq: Every evening | ORAL | 0 refills | Status: DC | PRN

## 2022-08-11 MED ORDER — MELOXICAM 15 MG PO TABS: 15.0000 mg | ORAL_TABLET | Freq: Every day | ORAL | 1 refills | Status: AC
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
## 2022-08-19 ENCOUNTER — Ambulatory Visit (INDEPENDENT_AMBULATORY_CARE_PROVIDER_SITE_OTHER): Payer: Medicare Other | Admitting: Family Medicine

## 2022-08-19 ENCOUNTER — Encounter: Payer: Self-pay | Admitting: Family Medicine

## 2022-08-19 VITALS — BP 124/78 | HR 89 | Temp 98.6°F | Ht 73.0 in | Wt 158.2 lb

## 2022-08-19 DIAGNOSIS — R29898 Other symptoms and signs involving the musculoskeletal system: Secondary | ICD-10-CM

## 2022-08-19 DIAGNOSIS — M5416 Radiculopathy, lumbar region: Secondary | ICD-10-CM | POA: Diagnosis not present

## 2022-08-19 MED ORDER — METHYLPREDNISOLONE SODIUM SUCC 40 MG IJ SOLR
40.0000 mg | Freq: Once | INTRAMUSCULAR | Status: AC
  Administered 2022-08-19: 40 mg via INTRAMUSCULAR

## 2022-08-19 MED ORDER — PREDNISONE 50 MG PO TABS: ORAL_TABLET | ORAL | 0 refills | Status: AC

## 2022-08-19 NOTE — Patient Instructions (Addendum)
For lower back pain, please use prednisone as prescribed for back pain. Please hold meloxicam while taking prednisone.  You may continue Flexeril as needed.  Due to left leg weakness, we are ordering a CT scan. The office will call to schedule this.  Please follow-up in 1 week to reassess left lower leg weakness.   If developing severe weakness or incontinence or any other abnormal symptoms, please go to the emergency department.
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
## 2022-08-20 ENCOUNTER — Ambulatory Visit (HOSPITAL_BASED_OUTPATIENT_CLINIC_OR_DEPARTMENT_OTHER)
Admission: RE | Admit: 2022-08-20 | Discharge: 2022-08-20 | Disposition: A | Discharge status: Home / Self Care | Payer: Medicare Other | Source: Ambulatory Visit | Attending: Family Medicine | Admitting: Family Medicine

## 2022-08-20 DIAGNOSIS — R29898 Other symptoms and signs involving the musculoskeletal system: Secondary | ICD-10-CM | POA: Insufficient documentation

## 2022-08-20 DIAGNOSIS — M5416 Radiculopathy, lumbar region: Secondary | ICD-10-CM | POA: Insufficient documentation

## 2022-08-29 ENCOUNTER — Ambulatory Visit (INDEPENDENT_AMBULATORY_CARE_PROVIDER_SITE_OTHER): Payer: Medicare Other | Admitting: Family Medicine

## 2022-08-29 ENCOUNTER — Ambulatory Visit: Payer: Medicare Other | Admitting: Family Medicine

## 2022-08-29 ENCOUNTER — Encounter: Payer: Self-pay | Admitting: Family Medicine

## 2022-08-29 VITALS — BP 122/78 | HR 71 | Temp 97.5°F | Wt 159.6 lb

## 2022-08-29 DIAGNOSIS — S21232A Puncture wound without foreign body of left back wall of thorax without penetration into thoracic cavity, initial encounter: Secondary | ICD-10-CM | POA: Insufficient documentation

## 2022-08-29 DIAGNOSIS — M542 Cervicalgia: Secondary | ICD-10-CM | POA: Diagnosis not present

## 2022-08-29 DIAGNOSIS — S21232S Puncture wound without foreign body of left back wall of thorax without penetration into thoracic cavity, sequela: Secondary | ICD-10-CM

## 2022-08-29 DIAGNOSIS — R29898 Other symptoms and signs involving the musculoskeletal system: Secondary | ICD-10-CM | POA: Diagnosis not present

## 2022-08-29 DIAGNOSIS — M5416 Radiculopathy, lumbar region: Secondary | ICD-10-CM

## 2022-08-29 MED ORDER — CYCLOBENZAPRINE HCL 5 MG PO TABS: 5.0000 mg | ORAL_TABLET | Freq: Every evening | ORAL | 0 refills | Status: AC | PRN
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
## 2022-08-29 NOTE — Assessment & Plan Note (Signed)
The patient reports neck muscle soreness consistent with muscular strain  Plan:  Continue cyclobenzaprine (Flexeril) at the current dose as needed for muscle spasms. Provide home exercises for neck PT and stretches. Advise for application of local heat and massage as tolerated. Monitor for improvement or worsening of symptoms, with potential for imaging if no resolution or progression occurs.
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
## 2022-09-09 ENCOUNTER — Ambulatory Visit: Payer: Medicare Other | Attending: Family Medicine | Admitting: Physical Therapy

## 2022-09-09 ENCOUNTER — Other Ambulatory Visit: Payer: Self-pay

## 2022-09-09 ENCOUNTER — Encounter: Payer: Self-pay | Admitting: Physical Therapy

## 2022-09-09 DIAGNOSIS — M5416 Radiculopathy, lumbar region: Secondary | ICD-10-CM | POA: Insufficient documentation

## 2022-09-09 DIAGNOSIS — M6281 Muscle weakness (generalized): Secondary | ICD-10-CM | POA: Diagnosis present

## 2022-09-09 DIAGNOSIS — R29898 Other symptoms and signs involving the musculoskeletal system: Secondary | ICD-10-CM | POA: Diagnosis not present

## 2022-09-09 DIAGNOSIS — R262 Difficulty in walking, not elsewhere classified: Secondary | ICD-10-CM | POA: Insufficient documentation

## 2022-09-09 DIAGNOSIS — R2681 Unsteadiness on feet: Secondary | ICD-10-CM | POA: Diagnosis present

## 2022-09-09 DIAGNOSIS — S21232S Puncture wound without foreign body of left back wall of thorax without penetration into thoracic cavity, sequela: Secondary | ICD-10-CM | POA: Diagnosis not present
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
## 2022-09-10 ENCOUNTER — Ambulatory Visit: Payer: Medicare Other | Admitting: Physical Therapy

## 2022-09-16 ENCOUNTER — Ambulatory Visit: Payer: Medicare Other | Admitting: Physical Therapy

## 2022-09-17 ENCOUNTER — Ambulatory Visit: Payer: Medicare Other | Admitting: Physical Therapy

## 2022-09-23 ENCOUNTER — Ambulatory Visit: Payer: Medicare Other

## 2022-09-24 ENCOUNTER — Ambulatory Visit: Payer: Medicare Other

## 2022-09-30 ENCOUNTER — Ambulatory Visit: Payer: Medicare Other | Admitting: Physical Therapy

## 2022-10-01 ENCOUNTER — Ambulatory Visit: Payer: Medicare Other

## 2022-10-07 ENCOUNTER — Telehealth: Payer: Self-pay | Admitting: Family Medicine

## 2022-10-07 NOTE — Telephone Encounter (Signed)
Called patient to schedule Medicare Annual Wellness Visit (AWV). Left message for patient to call back and schedule Medicare Annual Wellness Visit (AWV).  Last date of AWV:  awvi 06/10/09 per [facility]   Please schedule an appointment at any time with [facility].  If any questions, please contact me at [phone].  Thank you ,  Farrell Broerman [facility] AWV direct phone # [phone] 

## 2023-01-31 ENCOUNTER — Ambulatory Visit: Payer: Self-pay

## 2023-01-31 ENCOUNTER — Telehealth: Payer: Self-pay

## 2023-01-31 NOTE — Telephone Encounter (Signed)
This nurse called patient for AWV. He said that he would call back in a few minutes and hung up.

## 2023-03-10 ENCOUNTER — Ambulatory Visit: Payer: Self-pay
